# Patient Record
Sex: Male | Born: 1985 | Race: Black or African American | Hispanic: No | Marital: Single | Smoking: Former smoker
Health system: Southern US, Community
[De-identification: ages and names within clinical notes are randomized; demographics above are authoritative.]

## PROBLEM LIST (undated history)

## (undated) HISTORY — PX: HAND SURGERY: SHX662

## (undated) HISTORY — PX: FRACTURE SURGERY: SHX138

## (undated) HISTORY — PX: APPENDECTOMY: SHX54

---

## 2015-02-10 ENCOUNTER — Emergency Department
Admission: EM | Admit: 2015-02-10 | Discharge: 2015-02-10 | Disposition: A | Discharge status: Home / Self Care | Payer: No Typology Code available for payment source | Attending: Emergency Medicine | Admitting: Emergency Medicine

## 2015-02-10 ENCOUNTER — Emergency Department: Payer: No Typology Code available for payment source

## 2015-02-10 ENCOUNTER — Encounter: Payer: Self-pay | Admitting: Emergency Medicine

## 2015-02-10 DIAGNOSIS — S22080A Wedge compression fracture of T11-T12 vertebra, initial encounter for closed fracture: Secondary | ICD-10-CM | POA: Insufficient documentation

## 2015-02-10 DIAGNOSIS — Y9389 Activity, other specified: Secondary | ICD-10-CM | POA: Insufficient documentation

## 2015-02-10 DIAGNOSIS — IMO0002 Reserved for concepts with insufficient information to code with codable children: Secondary | ICD-10-CM

## 2015-02-10 DIAGNOSIS — Y92410 Unspecified street and highway as the place of occurrence of the external cause: Secondary | ICD-10-CM | POA: Insufficient documentation

## 2015-02-10 DIAGNOSIS — Y998 Other external cause status: Secondary | ICD-10-CM | POA: Insufficient documentation

## 2015-02-10 DIAGNOSIS — S299XXA Unspecified injury of thorax, initial encounter: Secondary | ICD-10-CM | POA: Diagnosis present

## 2015-02-10 MED ORDER — OXYCODONE-ACETAMINOPHEN 5-325 MG PO TABS: 1.0000 | ORAL_TABLET | Freq: Four times a day (QID) | ORAL | Status: AC | PRN

## 2015-02-10 MED ORDER — NAPROXEN 500 MG PO TABS: 500.0000 mg | ORAL_TABLET | Freq: Two times a day (BID) | ORAL | Status: DC

## 2015-02-10 NOTE — ED Notes (Signed)
MD Kinner at bedside  

## 2015-02-10 NOTE — ED Notes (Signed)
Patient transported to X-ray 
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
## 2015-02-10 NOTE — ED Notes (Signed)
Pt returned from X-ray.  

## 2016-06-02 ENCOUNTER — Emergency Department
Admission: EM | Admit: 2016-06-02 | Discharge: 2016-06-02 | Discharge status: Against Medical Advice | Payer: No Typology Code available for payment source

## 2017-05-28 ENCOUNTER — Emergency Department: Payer: Self-pay

## 2017-05-28 ENCOUNTER — Emergency Department
Admission: EM | Admit: 2017-05-28 | Discharge: 2017-05-28 | Disposition: A | Discharge status: Home / Self Care | Payer: Self-pay | Attending: Emergency Medicine | Admitting: Emergency Medicine

## 2017-05-28 DIAGNOSIS — S86112A Strain of other muscle(s) and tendon(s) of posterior muscle group at lower leg level, left leg, initial encounter: Secondary | ICD-10-CM

## 2017-05-28 DIAGNOSIS — S86812A Strain of other muscle(s) and tendon(s) at lower leg level, left leg, initial encounter: Secondary | ICD-10-CM | POA: Insufficient documentation

## 2017-05-28 DIAGNOSIS — X58XXXA Exposure to other specified factors, initial encounter: Secondary | ICD-10-CM | POA: Insufficient documentation

## 2017-05-28 DIAGNOSIS — Y9389 Activity, other specified: Secondary | ICD-10-CM | POA: Insufficient documentation

## 2017-05-28 DIAGNOSIS — F17200 Nicotine dependence, unspecified, uncomplicated: Secondary | ICD-10-CM | POA: Insufficient documentation

## 2017-05-28 DIAGNOSIS — M79605 Pain in left leg: Secondary | ICD-10-CM

## 2017-05-28 DIAGNOSIS — Y92018 Other place in single-family (private) house as the place of occurrence of the external cause: Secondary | ICD-10-CM | POA: Insufficient documentation

## 2017-05-28 DIAGNOSIS — Y999 Unspecified external cause status: Secondary | ICD-10-CM | POA: Insufficient documentation

## 2017-05-28 MED ORDER — HYDROCODONE-ACETAMINOPHEN 5-325 MG PO TABS: 1.0000 | ORAL_TABLET | Freq: Four times a day (QID) | ORAL | 0 refills | Status: DC | PRN

## 2017-05-28 MED ORDER — OXYCODONE-ACETAMINOPHEN 5-325 MG PO TABS
1.0000 | ORAL_TABLET | Freq: Once | ORAL | Status: AC
  Administered 2017-05-28: 1 via ORAL
  Filled 2017-05-28: qty 1

## 2017-05-28 NOTE — ED Notes (Signed)
Dr. Quale at bedside.  

## 2017-05-28 NOTE — ED Notes (Signed)
Patient transported to MRI 

## 2017-05-28 NOTE — ED Notes (Signed)
Checked on pt, resting comfortably at this time

## 2017-05-28 NOTE — Discharge Instructions (Signed)
Follow-up with Orthopedics in the coming week.  No driving while taking hydrocodone. Use only as prescribed.

## 2017-05-28 NOTE — ED Notes (Signed)
Knee immobilizer applied, crutches adjusted and given to pt

## 2017-05-28 NOTE — ED Triage Notes (Signed)
Patient c/o left lower leg/calf pain post fall at 1900 yesterday. Patient reports pain has slowly increased since original injury. Notable swelling seen on inspection of left calf.

## 2017-05-28 NOTE — ED Provider Notes (Signed)
[facility] Emergency Department Provider Note   First MD Initiated Contact with Patient 05/28/17 0410     (approximate)  I have reviewed the triage vital signs and the nursing notes.   HISTORY  Chief Complaint Leg Pain (left)    HPI Adrian Oconnor is a 32 y.o. male to the emergency department with nontraumatic left calf/knee pain which began at 7 PM last night.  Patient states that he was working on his car and then upon standing his left leg gave out following a pop.  Patient admits to 10 out of 10 pain since that time.  Patient states the pain is worse with standing on the leg and any ambulation.  Patient denies any alleviating factors.    Past medical history None There are no active problems to display for this patient.   Past surgical history None  Prior to Admission medications   Medication Sig Start Date End Date Taking? Authorizing Provider  naproxen (NAPROSYN) 500 MG tablet Take 1 tablet (500 mg total) by mouth 2 (two) times daily with a meal. 02/10/15   Jene Every, MD    Allergies No known drug allergies No family history on file.  Social History Social History   Tobacco Use  . Smoking status: Current Every Day Smoker  . Smokeless tobacco: Never Used  Substance Use Topics  . Alcohol use: Yes  . Drug use: No    Review of Systems Constitutional: No fever/chills Eyes: No visual changes. ENT: No sore throat. Cardiovascular: Denies chest pain. Respiratory: Denies shortness of breath. Gastrointestinal: No abdominal pain.  No nausea, no vomiting.  No diarrhea.  No constipation. Genitourinary: Negative for dysuria. Musculoskeletal: Negative for neck pain.  Negative for back pain.  Positive for left knee/calf pain. Integumentary: Negative for rash. Neurological: Negative for headaches, focal weakness or numbness.   ____________________________________________   PHYSICAL EXAM:  VITAL SIGNS: ED Triage Vitals  Enc Vitals  Group     BP 05/28/17 0416 121/80     Pulse Rate 05/28/17 0416 90     Resp 05/28/17 0416 18     Temp 05/28/17 0416 97.9 F (36.6 C)     Temp Source 05/28/17 0416 Oral     SpO2 05/28/17 0416 97 %     Weight 05/28/17 0415 67.1 kg (148 lb)     Height 05/28/17 0415 1.702 m (5\' 7" )     Head Circumference --      Peak Flow --      Pain Score 05/28/17 0414 10     Pain Loc --      Pain Edu? --      Excl. in GC? --     Constitutional: Alert and oriented. Well appearing and in no acute distress. Eyes: Conjunctivae are normal.  Head: Atraumatic. Mouth/Throat: Mucous membranes are moist.  Oropharynx non-erythematous. Neck: No stridor.  Cardiovascular: Normal rate, regular rhythm. Good peripheral circulation. Grossly normal heart sounds. Respiratory: Normal respiratory effort.  No retractions. Lungs CTAB. Gastrointestinal: Soft and nontender. No distention.  Musculoskeletal: Left calf pain with dorsiflexion of the left foot.  Pain with palpation of the left calf.  Pain with palpation and stressing  of the medial collateral ligament of the left knee.   Neurologic:  Normal speech and language. No gross focal neurologic deficits are appreciated.  Skin:  Skin is warm, dry and intact. No rash noted. Psychiatric: Mood and affect are normal. Speech and behavior are normal.   RADIOLOGY I, Stephenson N Asyah Candler,  personally viewed and evaluated these images (plain radiographs) as part of my medical decision making, as well as reviewing the written report by the radiologist.  Dg Tibia/fibula Left  Result Date: 05/28/2017 CLINICAL DATA:  Left lower leg and calf pain after a fall at 1900 hours yesterday. Pain increasing. EXAM: LEFT TIBIA AND FIBULA - 2 VIEW COMPARISON:  None. FINDINGS: There is no evidence of fracture or other focal bone lesions. Soft tissues are unremarkable. IMPRESSION: Negative. Electronically Signed   By: Burman NievesWilliam  Stevens M.D.   On: 05/28/2017 05:06       Procedures   ____________________________________________   INITIAL IMPRESSION / ASSESSMENT AND PLAN / ED COURSE  As part of my medical decision making, I reviewed the following data within the electronic MEDICAL RECORD [id number] year old male presented with above-stated history of nontraumatic left knee/calf pain.  X-ray revealed no acute abnormality.  Suspect possible ligamentous versus tendinous injury and as such MRI of the left lower extremity performed.  Patient given a Percocet in the emergency department with some improvement of pain awaiting MRI results at this time anticipate that the patient will be able to did be discharged home.  Patient's care transferred to Dr. Fanny BienQuale ____________________________________________  FINAL CLINICAL IMPRESSION(S) / ED DIAGNOSES  Final diagnoses:  Left leg pain     MEDICATIONS GIVEN DURING THIS VISIT:  Medications  oxyCODONE-acetaminophen (PERCOCET/ROXICET) 5-325 MG per tablet 1 tablet (1 tablet Oral Given 05/28/17 0435)     ED Discharge Orders    None       Note:  This document was prepared using Dragon voice recognition software and may include unintentional dictation errors.    Darci CurrentBrown, [place] N, MD 05/28/17 [phone]

## 2017-05-28 NOTE — ED Provider Notes (Signed)
Dg Tibia/fibula Left  Result Date: 05/28/2017 CLINICAL DATA:  Left lower leg and calf pain after a fall at 1900 hours yesterday. Pain increasing. EXAM: LEFT TIBIA AND FIBULA - 2 VIEW COMPARISON:  None. FINDINGS: There is no evidence of fracture or other focal bone lesions. Soft tissues are unremarkable. IMPRESSION: Negative. Electronically Signed   By: Burman NievesWilliam  Stevens M.D.   On: 05/28/2017 05:06   Mr Tibia Fibula Left Wo Contrast  Result Date: 05/28/2017 CLINICAL DATA:  Mid left lower leg pain and swelling since falling last night. EXAM: MRI OF LOWER LEFT EXTREMITY WITHOUT CONTRAST TECHNIQUE: Multiplanar, multisequence MR imaging of the left lower leg was performed. No intravenous contrast was administered. COMPARISON:  Radiograph same day. FINDINGS: Bones/Joint/Cartilage Examination extends from the knee through the distal lower leg. The ankle is incompletely visualized. The examination includes both lower legs on the axial and coronal images. The bones appear normal. Ligaments Not relevant for exam/indication. Muscles and Tendons There is T2 hyperintensity within medial head of the left gastrocnemius muscle distally. A small amount of fluid signal is interposed between gastrocnemius and soleus muscles distally. The plantaris tendon is not well visualized. The visualized Achilles tendons appear normal. No abnormalities are seen in right leg. Soft tissues No other signal abnormalities are identified in either lower leg. No vascular abnormalities are seen. IMPRESSION: 1. Muscular T2 hyperintensity medially in the left calf consistent with tennis leg. Edema is present within the medial head of the gastrocnemius muscle, and these findings are most commonly secondary to a partial tear at the myotendinous junction of the gastrocnemius muscle. Less common etiology is rupture of the plantaris tendon. 2. No osseous abnormality. Electronically Signed   By: Carey BullocksWilliam  Veazey M.D.   On: 05/28/2017 08:13     Patient  resting comfortably.  On exam he reports pain points towards the medial left gastrocnemius muscle, this correlates well with his MRI findings.  He is resting comfortably at this time.  Discussed careful need for follow-up with orthopedics, work restrictions, crutches.  Alert and oriented.  Left leg trong dorsalis pedis and posterior tibial pulses.  Capillary refill normal.  Denies numbness tingling in the lower foot.  I will prescribe the patient a narcotic pain medicine due to their condition which I anticipate will cause at least moderate pain short term. I discussed with the patient safe use of narcotic pain medicines, and that they are not to drive, work in dangerous areas, or ever take more than prescribed (no more than 1 pill every 6 hours). We discussed that this is the type of medication that can be  overdosed on and the risks of this type of medicine. Patient is very agreeable to only use as prescribed and to never use more than prescribed.   Return precautions and treatment recommendations and follow-up discussed with the patient who is agreeable with the plan.  Patient reports he like to be able to go back to work fully, but I again enforced the need for use of crutches and that he needs to be on limited duty, no heavy lifting, must use crutches and no weightbearing on the left foot until seen and cleared to return by orthopedics.  He is agreeable, and I have again reinforced his need as he could suffer further injury if he attempts to do any heavy lifting, walking on the leg while at still healing.  He will follow-up with orthopedics in about 1 week.  His friend is driving him home.    Jevaughn Degollado,  Loraine Leriche, MD 05/28/17 0900

## 2017-07-12 ENCOUNTER — Other Ambulatory Visit: Payer: Self-pay

## 2017-07-12 ENCOUNTER — Ambulatory Visit
Admission: EM | Admit: 2017-07-12 | Discharge: 2017-07-12 | Disposition: A | Discharge status: Home / Self Care | Payer: BLUE CROSS/BLUE SHIELD | Attending: Family Medicine | Admitting: Family Medicine

## 2017-07-12 DIAGNOSIS — S86112D Strain of other muscle(s) and tendon(s) of posterior muscle group at lower leg level, left leg, subsequent encounter: Secondary | ICD-10-CM

## 2017-07-12 DIAGNOSIS — S86112A Strain of other muscle(s) and tendon(s) of posterior muscle group at lower leg level, left leg, initial encounter: Secondary | ICD-10-CM

## 2017-07-12 NOTE — ED Triage Notes (Signed)
Patient states that he was seen by ED in January for a calf injury due to running. Patient states that he received light duty restrictions. Patient currently has no pain and returned to normal functioning states that he needs a note for work stating no restrictions.

## 2017-07-12 NOTE — ED Provider Notes (Signed)
[facility] ____________________________________________  Time seen: Approximately 2:53 PM  I have reviewed the triage vital signs and the nursing notes.   HISTORY  Chief Complaint Leg Pain   HPI Adrian Oconnor is a 32 y.o. male presenting for reevaluation and follow-up of left leg injury that occurred in January.  Patient reports that he was sprinting racing his younger cousins and then afterwards felt pain to his left leg in which she was seen in the emergency room for.  States at that time he had an MRI completed and it showed a muscle tear in his left leg.  Results of MRI reviewed by myself of 05/28/17 showing suspected gastrocnemius muscle tear.  Patient reports that he rested his leg for approximately 1 week, and reports at that point he increased his activity to normal at home and at work.  States he has been doing his regular job for over a month which does include frequent physical activity.  States however his file at work was reviewed and still noted to have a work limitation note present without restrictions being lifted, and states as he is up for promotion he needs a note stating no restrictions.  Patient denies any pain or complaints at this time.  States that he feels good.  Has continued to remain active.  Denies any pain, swelling, ecchymosis, paresthesias or discomfort in his left leg.   History reviewed. No pertinent past medical history.  There are no active problems to display for this patient.   Past Surgical History:  Procedure Laterality Date  . NO PAST SURGERIES       No current facility-administered medications for this encounter.  No current outpatient medications on file.  Allergies Patient has no known allergies.  History reviewed. No pertinent family history.  Social History Social History   Tobacco Use  . Smoking status: Current Every Day Smoker  . Smokeless tobacco: Never Used  Substance Use Topics  . Alcohol use: Yes  . Drug  use: No    Review of Systems Constitutional: No fever/chills Cardiovascular: Denies chest pain. Respiratory: Denies shortness of breath. Musculoskeletal: Negative for back pain. Skin: Negative for rash.  ____________________________________________   PHYSICAL EXAM:  VITAL SIGNS: ED Triage Vitals  Enc Vitals Group     BP 07/12/17 1421 123/70     Pulse Rate 07/12/17 1421 73     Resp 07/12/17 1421 18     Temp 07/12/17 1421 98.2 F (36.8 C)     Temp Source 07/12/17 1421 Oral     SpO2 07/12/17 1421 98 %     Weight 07/12/17 1419 145 lb (65.8 kg)     Height 07/12/17 1419 5\' 7"  (1.702 m)     Head Circumference --      Peak Flow --      Pain Score 07/12/17 1419 0     Pain Loc --      Pain Edu? --      Excl. in GC? --     Constitutional: Alert and oriented. Well appearing and in no acute distress. Cardiovascular: Normal rate, regular rhythm. Grossly normal heart sounds.  Good peripheral circulation. Respiratory: Normal respiratory effort without tachypnea nor retractions. Breath sounds are clear and equal bilaterally. No wheezes, rales, rhonchi. Musculoskeletal: Steady gait. Bilateral pedal pulses equal and easily palpated. Bilateral lower extremities nontender to palpation with full range of motion, no pain with range of motion, able to weight bear on each leg, able to weight-bear plantar flexion and dorsiflexion without  pain, no edema noted bilaterally.  Neurologic:  Normal speech and language. No gross focal neurologic deficits are appreciated. Speech is normal. No gait instability.  Skin:  Skin is warm, dry and intact. No rash noted. Psychiatric: Mood and affect are normal. Speech and behavior are normal. Patient exhibits appropriate insight and judgment   ___________________________________________   LABS (all labs ordered are listed, but only abnormal results are displayed)  Labs Reviewed - No data to  display ____________________________________________  RADIOLOGY  Reviewed MRI results from 05/28/2017.  PROCEDURES Procedures    INITIAL IMPRESSION / ASSESSMENT AND PLAN / ED COURSE  Pertinent labs & imaging results that were available during my care of the patient were reviewed by me and considered in my medical decision making (see chart for details).  Well-appearing patient.  No acute distress.  History of gastrocnemius muscle tear in January of this year.  Patient reports he quickly healed and is been doing well.  States he needs a work note stating no restrictions, note given.  Discussed follow up and return parameters including no resolution or any worsening concerns. Patient verbalized understanding and agreed to plan.   ____________________________________________   FINAL CLINICAL IMPRESSION(S) / ED DIAGNOSES  Final diagnoses:  Gastrocnemius muscle tear, left, subsequent encounter     ED Discharge Orders    None       Note: This dictation was prepared with Dragon dictation along with smaller phrase technology. Any transcriptional errors that result from this process are unintentional.         Renford DillsMiller, Cheyrl Buley, NP 07/12/17 1511

## 2017-07-12 NOTE — Discharge Instructions (Signed)
° °  Follow up with your primary care physician this week as needed. Return to Urgent care for new or worsening concerns.  ° °

## 2017-07-28 ENCOUNTER — Emergency Department: Payer: BLUE CROSS/BLUE SHIELD

## 2017-07-28 ENCOUNTER — Emergency Department
Admission: EM | Admit: 2017-07-28 | Discharge: 2017-07-28 | Disposition: A | Discharge status: Home / Self Care | Payer: BLUE CROSS/BLUE SHIELD | Attending: Emergency Medicine | Admitting: Emergency Medicine

## 2017-07-28 ENCOUNTER — Encounter: Payer: Self-pay | Admitting: Emergency Medicine

## 2017-07-28 ENCOUNTER — Other Ambulatory Visit: Payer: Self-pay

## 2017-07-28 DIAGNOSIS — Y9389 Activity, other specified: Secondary | ICD-10-CM | POA: Diagnosis not present

## 2017-07-28 DIAGNOSIS — S60222A Contusion of left hand, initial encounter: Secondary | ICD-10-CM

## 2017-07-28 DIAGNOSIS — S39012A Strain of muscle, fascia and tendon of lower back, initial encounter: Secondary | ICD-10-CM | POA: Diagnosis not present

## 2017-07-28 DIAGNOSIS — Y92410 Unspecified street and highway as the place of occurrence of the external cause: Secondary | ICD-10-CM | POA: Insufficient documentation

## 2017-07-28 DIAGNOSIS — S3992XA Unspecified injury of lower back, initial encounter: Secondary | ICD-10-CM | POA: Diagnosis present

## 2017-07-28 DIAGNOSIS — S86112A Strain of other muscle(s) and tendon(s) of posterior muscle group at lower leg level, left leg, initial encounter: Secondary | ICD-10-CM

## 2017-07-28 DIAGNOSIS — Y999 Unspecified external cause status: Secondary | ICD-10-CM | POA: Insufficient documentation

## 2017-07-28 DIAGNOSIS — F17200 Nicotine dependence, unspecified, uncomplicated: Secondary | ICD-10-CM | POA: Diagnosis not present

## 2017-07-28 MED ORDER — METHOCARBAMOL 500 MG PO TABS: 500.0000 mg | ORAL_TABLET | Freq: Four times a day (QID) | ORAL | 0 refills | Status: DC

## 2017-07-28 MED ORDER — MELOXICAM 15 MG PO TABS: 15.0000 mg | ORAL_TABLET | Freq: Every day | ORAL | 0 refills | Status: DC

## 2017-07-28 MED ORDER — OXYCODONE-ACETAMINOPHEN 5-325 MG PO TABS
1.0000 | ORAL_TABLET | Freq: Once | ORAL | Status: AC
Start: 2017-07-28 — End: 2017-07-28
  Administered 2017-07-28: 1 via ORAL
  Filled 2017-07-28: qty 1

## 2017-07-28 MED ORDER — METHOCARBAMOL 500 MG PO TABS
1000.0000 mg | ORAL_TABLET | Freq: Once | ORAL | Status: AC
  Administered 2017-07-28: 500 mg via ORAL
  Filled 2017-07-28: qty 2

## 2017-07-28 MED ORDER — MELOXICAM 7.5 MG PO TABS
15.0000 mg | ORAL_TABLET | Freq: Once | ORAL | Status: AC
Start: 2017-07-28 — End: 2017-07-28
  Administered 2017-07-28: 15 mg via ORAL
  Filled 2017-07-28: qty 2

## 2017-07-28 NOTE — ED Notes (Signed)
Patient transported to X-ray 
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
## 2017-09-08 ENCOUNTER — Emergency Department: Payer: BLUE CROSS/BLUE SHIELD

## 2017-09-08 ENCOUNTER — Other Ambulatory Visit: Payer: Self-pay

## 2017-09-08 DIAGNOSIS — Z79899 Other long term (current) drug therapy: Secondary | ICD-10-CM | POA: Insufficient documentation

## 2017-09-08 DIAGNOSIS — F17210 Nicotine dependence, cigarettes, uncomplicated: Secondary | ICD-10-CM | POA: Diagnosis not present

## 2017-09-08 DIAGNOSIS — R0602 Shortness of breath: Secondary | ICD-10-CM | POA: Diagnosis not present

## 2017-09-08 DIAGNOSIS — F959 Tic disorder, unspecified: Secondary | ICD-10-CM | POA: Insufficient documentation

## 2017-09-08 LAB — CBC WITH DIFFERENTIAL/PLATELET
Basophils Absolute: 0 10*3/uL (ref 0–0.1)
Basophils Relative: 0 %
Eosinophils Absolute: 0.1 10*3/uL (ref 0–0.7)
Eosinophils Relative: 2 %
HCT: 43.2 % (ref 40.0–52.0)
HEMOGLOBIN: 14.9 g/dL (ref 13.0–18.0)
LYMPHS ABS: 2.3 10*3/uL (ref 1.0–3.6)
LYMPHS PCT: 46 %
MCH: 30.6 pg (ref 26.0–34.0)
MCHC: 34.6 g/dL (ref 32.0–36.0)
MCV: 88.5 fL (ref 80.0–100.0)
Monocytes Absolute: 0.4 10*3/uL (ref 0.2–1.0)
Monocytes Relative: 8 %
NEUTROS PCT: 44 %
Neutro Abs: 2.2 10*3/uL (ref 1.4–6.5)
Platelets: 254 10*3/uL (ref 150–440)
RBC: 4.88 MIL/uL (ref 4.40–5.90)
RDW: 12.8 % (ref 11.5–14.5)
WBC: 5.1 10*3/uL (ref 3.8–10.6)

## 2017-09-08 NOTE — ED Triage Notes (Signed)
Pt states that he is having difficulty breathing - he states that he was at work Saturday and became short of breath - when he returned to work today they told him he needed to go to ED to be evaluated - pt denies any shortness of breath or difficulty of breathing at this time - he says this has been on and off since age 32

## 2017-09-09 ENCOUNTER — Emergency Department
Admission: EM | Admit: 2017-09-09 | Discharge: 2017-09-09 | Disposition: A | Discharge status: Home / Self Care | Payer: BLUE CROSS/BLUE SHIELD | Attending: Emergency Medicine | Admitting: Emergency Medicine

## 2017-09-09 DIAGNOSIS — F959 Tic disorder, unspecified: Secondary | ICD-10-CM

## 2017-09-09 LAB — COMPREHENSIVE METABOLIC PANEL
ALT: 22 U/L (ref 17–63)
ANION GAP: 8 (ref 5–15)
AST: 27 U/L (ref 15–41)
Albumin: 4.6 g/dL (ref 3.5–5.0)
Alkaline Phosphatase: 63 U/L (ref 38–126)
BUN: 9 mg/dL (ref 6–20)
CO2: 29 mmol/L (ref 22–32)
CREATININE: 0.85 mg/dL (ref 0.61–1.24)
Calcium: 9.5 mg/dL (ref 8.9–10.3)
Chloride: 102 mmol/L (ref 101–111)
Glucose, Bld: 88 mg/dL (ref 65–99)
Potassium: 3.3 mmol/L — ABNORMAL LOW (ref 3.5–5.1)
Sodium: 139 mmol/L (ref 135–145)
Total Bilirubin: 1.7 mg/dL — ABNORMAL HIGH (ref 0.3–1.2)
Total Protein: 8 g/dL (ref 6.5–8.1)

## 2017-09-09 NOTE — ED Provider Notes (Signed)
[facility] Emergency Department Provider Note  Time seen: 3:16 AM  I have reviewed the triage vital signs and the nursing notes.   HISTORY  Chief Complaint Shortness of Breath    HPI Adrian Oconnor is a 32 y.o. male with no past medical history who presents to the emergency department for difficulty breathing/making a noise.  According to the patient ever since he was 32 years old he has been making a short cast squeak noise breathing and quickly.  Patient states it has become more noticeable and he had a happen a lot at work on Saturday to the point where his boss wanted him to go get checked out.  He returned to work yesterday, but he said since he was still making the noise the box wanted him to go to the ER to make sure he was okay to work.  Patient denies any chest pain or trouble breathing.  Denies any asthma.  Largely negative review of systems.  Patient makes noise multiple times throughout our evaluation and history.  It is much more consistent with a tic disorder than any type of shortness of breath.   History reviewed. No pertinent past medical history.  There are no active problems to display for this patient.   Past Surgical History:  Procedure Laterality Date  . NO PAST SURGERIES      Prior to Admission medications   Medication Sig Start Date End Date Taking? Authorizing Provider  meloxicam (MOBIC) 15 MG tablet Take 1 tablet (15 mg total) by mouth daily. 07/28/17   Cuthriell, Delorise Royals, PA-C  methocarbamol (ROBAXIN) 500 MG tablet Take 1 tablet (500 mg total) by mouth 4 (four) times daily. 07/28/17   Cuthriell, Delorise Royals, PA-C    No Known Allergies  No family history on file.  Social History Social History   Tobacco Use  . Smoking status: Current Every Day Smoker    Packs/day: 0.50    Types: Cigarettes  . Smokeless tobacco: Never Used  Substance Use Topics  . Alcohol use: Not Currently  . Drug use: No    Review of  Systems Constitutional: Negative for fever. Cardiovascular: Negative for chest pain. Respiratory: Negative for shortness of breath.  Positive for making a noise when he breathes in at times. Gastrointestinal: Negative for abdominal pain All other ROS negative  ____________________________________________   PHYSICAL EXAM:  VITAL SIGNS: ED Triage Vitals [09/08/17 2257]  Enc Vitals Group     BP 137/89     Pulse Rate 94     Resp 16     Temp (!) 97.2 F (36.2 C)     Temp Source Oral     SpO2 100 %     Weight 140 lb (63.5 kg)     Height  (1.676 m)     Head Circumference      Peak Flow      Pain Score 0     Pain Loc      Pain Edu?      Excl. in GC?     Constitutional: Alert and oriented. Well appearing and in no distress. Eyes: Normal exam ENT   Head: Normocephalic and atraumatic.   Mouth/Throat: Mucous membranes are moist. Cardiovascular: Normal rate, regular rhythm. No murmur Respiratory: Normal respiratory effort without tachypnea nor retractions. Breath sounds are clear and equal bilaterally. No wheezes/rales/rhonchi. Gastrointestinal: Soft and nontender. No distention. Musculoskeletal: Nontender with normal range of motion in all extremities.  Neurologic:  Normal speech and language.  No gross focal neurologic deficits  Skin:  Skin is warm, dry and intact.  Psychiatric: Mood and affect are normal.  ____________________________________________   RADIOLOGY  Chest x-ray shows no acute disease.  ____________________________________________   INITIAL IMPRESSION / ASSESSMENT AND PLAN / ED COURSE  Pertinent labs & imaging results that were available during my care of the patient were reviewed by me and considered in my medical decision making (see chart for details).  Patient presents to the emergency department for making a noise when he breathes and very quickly.  Patient makes a short high-pitched noise, this is a tic disorder.  Not stridor, no  shortness of breath, no wheeze.  Patient states he has been told he had a tic disorder as a child but states it will go away for months or years at a time and then come back.  States it has been more prevalent recently.  Denies any trouble breathing.  Patient's labs are normal.  X-ray is normal.  I discussed with the patient follow-up with a primary care doctor for further evaluation and possible trial medication if his PCP believed it was warranted for tic disorder.  ____________________________________________   FINAL CLINICAL IMPRESSION(S) / ED DIAGNOSES  Tic disorder    Minna Antis, MD 09/09/17 [phone]

## 2017-09-09 NOTE — ED Notes (Signed)
Per pt, states he gets infrequent episodes where he feels [facility]. Pt states he was at work and was dehydrated from not drinking water and had an episode. Pt states this occurred on Saturday. Pt denies having this feeling at this time, denies hx of asthma. Pt states he has a chronic morning cough but once he is able to cough up the sputum he denies any further complications with the cough. Pt A&O at this time and in NAD.

## 2017-09-09 NOTE — ED Notes (Signed)
Pt to front desk requesting work note so pt can LWBS. Pt educated on policy that pt has not seen ED provider to give work note. Pt sts, "Okay, fine I guess I will just stay."

## 2017-10-18 ENCOUNTER — Other Ambulatory Visit: Payer: Self-pay

## 2017-10-18 ENCOUNTER — Emergency Department
Admission: EM | Admit: 2017-10-18 | Discharge: 2017-10-18 | Disposition: A | Discharge status: Home / Self Care | Payer: BLUE CROSS/BLUE SHIELD | Attending: Emergency Medicine | Admitting: Emergency Medicine

## 2017-10-18 ENCOUNTER — Emergency Department: Payer: BLUE CROSS/BLUE SHIELD

## 2017-10-18 DIAGNOSIS — F17210 Nicotine dependence, cigarettes, uncomplicated: Secondary | ICD-10-CM | POA: Diagnosis not present

## 2017-10-18 DIAGNOSIS — Y999 Unspecified external cause status: Secondary | ICD-10-CM | POA: Diagnosis not present

## 2017-10-18 DIAGNOSIS — Z79899 Other long term (current) drug therapy: Secondary | ICD-10-CM | POA: Insufficient documentation

## 2017-10-18 DIAGNOSIS — Y939 Activity, unspecified: Secondary | ICD-10-CM | POA: Diagnosis not present

## 2017-10-18 DIAGNOSIS — Y929 Unspecified place or not applicable: Secondary | ICD-10-CM | POA: Diagnosis not present

## 2017-10-18 DIAGNOSIS — R0789 Other chest pain: Secondary | ICD-10-CM | POA: Insufficient documentation

## 2017-10-18 DIAGNOSIS — R1012 Left upper quadrant pain: Secondary | ICD-10-CM | POA: Insufficient documentation

## 2017-10-18 LAB — BASIC METABOLIC PANEL
ANION GAP: 9 (ref 5–15)
BUN: 11 mg/dL (ref 6–20)
CHLORIDE: 106 mmol/L (ref 101–111)
CO2: 25 mmol/L (ref 22–32)
CREATININE: 0.89 mg/dL (ref 0.61–1.24)
Calcium: 9.3 mg/dL (ref 8.9–10.3)
GFR calc non Af Amer: >60 mL/min (ref >60)
Glucose, Bld: 97 mg/dL (ref 65–99)
Potassium: 3.5 mmol/L (ref 3.5–5.1)
Sodium: 140 mmol/L (ref 135–145)

## 2017-10-18 LAB — CBC
HEMATOCRIT: 42.4 % (ref 40.0–52.0)
HEMOGLOBIN: 14.5 g/dL (ref 13.0–18.0)
MCH: 30.3 pg (ref 26.0–34.0)
MCHC: 34.2 g/dL (ref 32.0–36.0)
MCV: 88.4 fL (ref 80.0–100.0)
Platelets: 250 10*3/uL (ref 150–440)
RBC: 4.79 MIL/uL (ref 4.40–5.90)
RDW: 12.8 % (ref 11.5–14.5)
WBC: 6.2 10*3/uL (ref 3.8–10.6)

## 2017-10-18 MED ORDER — MORPHINE SULFATE (PF) 4 MG/ML IV SOLN
4.0000 mg | Freq: Once | INTRAVENOUS | Status: AC
  Administered 2017-10-18: 4 mg via INTRAVENOUS
  Filled 2017-10-18: qty 1

## 2017-10-18 MED ORDER — HYDROCODONE-ACETAMINOPHEN 5-325 MG PO TABS: 1.0000 | ORAL_TABLET | ORAL | 0 refills | Status: DC | PRN

## 2017-10-18 MED ORDER — ONDANSETRON HCL 4 MG/2ML IJ SOLN
4.0000 mg | Freq: Once | INTRAMUSCULAR | Status: AC
  Administered 2017-10-18: 4 mg via INTRAVENOUS
  Filled 2017-10-18: qty 2

## 2017-10-18 MED ORDER — IOPAMIDOL (ISOVUE-370) INJECTION 76%
75.0000 mL | Freq: Once | INTRAVENOUS | Status: AC | PRN
  Administered 2017-10-18: 75 mL via INTRAVENOUS

## 2017-10-18 MED ORDER — SODIUM CHLORIDE 0.9 % IV BOLUS
1000.0000 mL | Freq: Once | INTRAVENOUS | Status: AC
Start: 2017-10-18 — End: 2017-10-18
  Administered 2017-10-18: 1000 mL via INTRAVENOUS

## 2017-10-18 NOTE — ED Notes (Signed)
Patient transported to CT 
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
## 2017-10-26 ENCOUNTER — Emergency Department
Admission: EM | Admit: 2017-10-26 | Discharge: 2017-10-26 | Disposition: A | Discharge status: Home / Self Care | Payer: BLUE CROSS/BLUE SHIELD | Attending: Emergency Medicine | Admitting: Emergency Medicine

## 2017-10-26 ENCOUNTER — Encounter: Payer: Self-pay | Admitting: Emergency Medicine

## 2017-10-26 DIAGNOSIS — Z711 Person with feared health complaint in whom no diagnosis is made: Secondary | ICD-10-CM | POA: Insufficient documentation
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
## 2018-03-17 ENCOUNTER — Other Ambulatory Visit: Payer: Self-pay

## 2018-03-17 ENCOUNTER — Ambulatory Visit
Admission: EM | Admit: 2018-03-17 | Discharge: 2018-03-17 | Disposition: A | Discharge status: Home / Self Care | Payer: BLUE CROSS/BLUE SHIELD | Attending: Family Medicine | Admitting: Family Medicine

## 2018-03-17 DIAGNOSIS — J020 Streptococcal pharyngitis: Secondary | ICD-10-CM

## 2018-03-17 LAB — RAPID STREP SCREEN (MED CTR MEBANE ONLY): STREPTOCOCCUS, GROUP A SCREEN (DIRECT): POSITIVE — AB

## 2018-03-17 MED ORDER — AMOXICILLIN 500 MG PO TABS: 500.0000 mg | ORAL_TABLET | Freq: Two times a day (BID) | ORAL | 0 refills | Status: DC

## 2018-03-17 MED ORDER — LIDOCAINE VISCOUS HCL 2 % MT SOLN: OROMUCOSAL | 0 refills | Status: DC

## 2018-03-17 NOTE — ED Provider Notes (Signed)
[facility]    CSN: [id number] Arrival date & time: 03/17/18  1001  History   Chief Complaint Chief Complaint  Patient presents with  . Sore Throat   HPI  32 year old male presents with sore throat.  Patient reports that he has not been going well since Monday.  Reports severe sore throat.  Decreased p.o. intake.  Associated subjective fever.  He has taken NyQuil without resolution.  He reports associated shortness of breath.  When asked to further explain this he reports that he has difficulty when breathing through his mouth.  No other associated symptoms.  No other complaints.  PMH, Surgical Hx, Family Hx, Social History reviewed and updated as below.  PMH: Acne  Past Surgical History:  Procedure Laterality Date  . APPENDECTOMY    . HAND SURGERY Right    Home Medications    Prior to Admission medications   Medication Sig Start Date End Date Taking? Authorizing Provider  amoxicillin (AMOXIL) 500 MG tablet Take 1 tablet (500 mg total) by mouth 2 (two) times daily. 03/17/18   Tommie Samsook, Vinod Mikesell G, DO  lidocaine (XYLOCAINE) 2 % solution Gargle 15 mL every 3 hours as needed. May swallow if desired. 03/17/18   Tommie Samsook, Trayven Lumadue G, DO    Family History Family History  Problem Relation Age of Onset  . Hypertension Mother   . Hypertension Father     Social History Social History   Tobacco Use  . Smoking status: Former Smoker    Last attempt to quit: 03/13/2018    Years since quitting: 0.0  . Smokeless tobacco: Never Used  Substance Use Topics  . Alcohol use: Not Currently  . Drug use: No     Allergies   Patient has no known allergies.   Review of Systems Review of Systems  Constitutional: Positive for fever.  HENT: Positive for sore throat.   Respiratory: Positive for shortness of breath.    Physical Exam Triage Vital Signs ED Triage Vitals  Enc Vitals Group     BP 03/17/18 1012 122/81     Pulse Rate 03/17/18 1012 93     Resp 03/17/18 1012 18   Temp 03/17/18 1012 99.4 F (37.4 C)     Temp Source 03/17/18 1012 Oral     SpO2 03/17/18 1012 100 %     Weight 03/17/18 1012 140 lb (63.5 kg)     Height 03/17/18 1012 5\' 8"  (1.727 m)     Head Circumference --      Peak Flow --      Pain Score 03/17/18 1011 10     Pain Loc --      Pain Edu? --      Excl. in GC? --    Updated Vital Signs BP 122/81 (BP Location: Left Arm)   Pulse 93   Temp 99.4 F (37.4 C) (Oral)   Resp 18   Ht 5\' 8"  (1.727 m)   Wt 63.5 kg   SpO2 100%   BMI 21.29 kg/m   Visual Acuity Right Eye Distance:   Left Eye Distance:   Bilateral Distance:    Right Eye Near:   Left Eye Near:    Bilateral Near:     Physical Exam  Constitutional: He is oriented to person, place, and time. He appears well-developed. No distress.  HENT:  Oropharynx with severe erythema, tonsillar edema.  Eyes: Conjunctivae are normal. Right eye exhibits no discharge. Left eye exhibits no discharge.  Neck: Neck supple.  Cardiovascular: Normal  rate and regular rhythm.  Pulmonary/Chest: Effort normal and breath sounds normal. He has no wheezes. He has no rales.  Lymphadenopathy:    He has cervical adenopathy.  Neurological: He is alert and oriented to person, place, and time.  Psychiatric: He has a normal mood and affect. His behavior is normal.  Nursing note and vitals reviewed.  UC Treatments / Results  Labs (all labs ordered are listed, but only abnormal results are displayed) Labs Reviewed  RAPID STREP SCREEN ([facility] ONLY) - Abnormal; Notable for the following components:      Result Value   Streptococcus, Group A Screen (Direct) POSITIVE (*)    All other components within normal limits    EKG None  Radiology No results found.  Procedures Procedures (including critical care time)  Medications Ordered in UC Medications - No data to display  Initial Impression / Assessment and Plan / UC Course  I have reviewed the triage vital signs and the nursing  notes.  Pertinent labs & imaging results that were available during my care of the patient were reviewed by me and considered in my medical decision making (see chart for details).    32 year old male presents with strep pharyngitis.  Treating with amoxicillin.  Viscous lidocaine as needed.  Final Clinical Impressions(s) / UC Diagnoses   Final diagnoses:  Strep pharyngitis     Discharge Instructions     Antibiotic as prescribed.  Take care  Dr. Adriana Simas    ED Prescriptions    Medication Sig Dispense Auth. Provider   amoxicillin (AMOXIL) 500 MG tablet Take 1 tablet (500 mg total) by mouth 2 (two) times daily. 20 tablet Hinton Luellen G, DO   lidocaine (XYLOCAINE) 2 % solution Gargle 15 mL every 3 hours as needed. May swallow if desired. 200 mL Tommie Sams, DO     Controlled Substance Prescriptions [place] Controlled Substance Registry consulted? Not Applicable   Tommie Sams, DO 03/17/18 1234

## 2018-03-17 NOTE — ED Triage Notes (Signed)
Patient complains of sore throat and fever since Monday. Painful to swallow or talk.

## 2018-03-17 NOTE — Discharge Instructions (Signed)
Antibiotic as prescribed.  Take care  Dr. Nuha Degner  
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
## 2018-07-31 ENCOUNTER — Other Ambulatory Visit: Payer: Self-pay

## 2018-07-31 ENCOUNTER — Emergency Department
Admission: EM | Admit: 2018-07-31 | Discharge: 2018-07-31 | Disposition: A | Discharge status: Home / Self Care | Payer: BLUE CROSS/BLUE SHIELD | Attending: Emergency Medicine | Admitting: Emergency Medicine

## 2018-07-31 ENCOUNTER — Encounter: Payer: Self-pay | Admitting: Emergency Medicine

## 2018-07-31 DIAGNOSIS — A084 Viral intestinal infection, unspecified: Secondary | ICD-10-CM | POA: Insufficient documentation

## 2018-07-31 DIAGNOSIS — Z87891 Personal history of nicotine dependence: Secondary | ICD-10-CM | POA: Insufficient documentation

## 2018-07-31 MED ORDER — ONDANSETRON 4 MG PO TBDP: 4.0000 mg | ORAL_TABLET | Freq: Three times a day (TID) | ORAL | 0 refills | Status: DC | PRN

## 2018-07-31 MED ORDER — ONDANSETRON 4 MG PO TBDP
4.0000 mg | ORAL_TABLET | Freq: Once | ORAL | Status: AC
  Administered 2018-07-31: 4 mg via ORAL
  Filled 2018-07-31: qty 1

## 2018-07-31 NOTE — ED Provider Notes (Signed)
[facility] Emergency Department Provider Note  ____________________________________________   First MD Initiated Contact with Patient 07/31/18 [phone]     (approximate)  I have reviewed the triage vital signs and the nursing notes.   HISTORY  Chief Complaint Diarrhea   HPI Adrian Oconnor is a 33 y.o. male presents to the ED with complaint of vomiting and diarrhea.  Patient states that in the last 24 hours he has had 1 diarrhea stool and also vomited once.  He began taking Pepto-Bismol yesterday 1 capful every 30 minutes.  He states his diarrhea has gone from brown, green and now black.  Patient denies any foul smell to his stools.  He denies any fever, chills, headache or body aches.     History reviewed. No pertinent past medical history.  There are no active problems to display for this patient.   Past Surgical History:  Procedure Laterality Date  . APPENDECTOMY    . HAND SURGERY Right     Prior to Admission medications   Medication Sig Start Date End Date Taking? Authorizing Provider  amoxicillin (AMOXIL) 500 MG tablet Take 1 tablet (500 mg total) by mouth 2 (two) times daily. 03/17/18   Tommie Sams, DO  lidocaine (XYLOCAINE) 2 % solution Gargle 15 mL every 3 hours as needed. May swallow if desired. 03/17/18   Tommie Sams, DO  ondansetron (ZOFRAN ODT) 4 MG disintegrating tablet Take 1 tablet (4 mg total) by mouth every 8 (eight) hours as needed for nausea or vomiting. 07/31/18   Tommi Rumps, PA-C    Allergies Patient has no known allergies.  Family History  Problem Relation Age of Onset  . Hypertension Mother   . Hypertension Father     Social History Social History   Tobacco Use  . Smoking status: Former Smoker    Last attempt to quit: 03/13/2018    Years since quitting: 0.3  . Smokeless tobacco: Never Used  Substance Use Topics  . Alcohol use: Not Currently  . Drug use: No    Review of Systems Constitutional: No  fever/chills Eyes: No visual changes. ENT: No sore throat. Cardiovascular: Denies chest pain. Respiratory: Denies shortness of breath.  No coughing. Gastrointestinal: No abdominal pain.  No nausea, positive vomiting.  Positive diarrhea.  No constipation. Genitourinary: Negative for dysuria. Musculoskeletal: Negative for muscle aches. Skin: Negative for rash. Neurological: Negative for headaches, focal weakness or numbness. ____________________________________________   PHYSICAL EXAM:  VITAL SIGNS: ED Triage Vitals  Enc Vitals Group     BP 07/31/18 0922 (!) 135/118     Pulse Rate 07/31/18 0922 86     Resp 07/31/18 0922 18     Temp 07/31/18 0922 98.3 F (36.8 C)     Temp Source 07/31/18 0922 Oral     SpO2 07/31/18 0922 100 %     Weight 07/31/18 0923 140 lb (63.5 kg)     Height 07/31/18 0923 5' 7.5" (1.715 m)     Head Circumference --      Peak Flow --      Pain Score 07/31/18 0922 0     Pain Loc --      Pain Edu? --      Excl. in GC? --    Constitutional: Alert and oriented. Well appearing and in no acute distress. Eyes: Conjunctivae are normal.  Head: Atraumatic. Nose: No congestion/rhinnorhea. Mouth/Throat: Mucous membranes are moist.  Oropharynx non-erythematous. Neck: No stridor.   Cardiovascular: Normal rate, regular rhythm.  Grossly normal heart sounds.  Good peripheral circulation. Respiratory: Normal respiratory effort.  No retractions. Lungs CTAB. Gastrointestinal: Soft and nontender. No distention.  Bowel sounds normoactive x4 quadrants. Musculoskeletal: His upper and lower extremities without any difficulty normal gait was noted without assistance. Neurologic:  Normal speech and language. No gross focal neurologic deficits are appreciated.  Skin:  Skin is warm, dry and intact. No rash noted. Psychiatric: Mood and affect are normal. Speech and behavior are normal.  ____________________________________________   LABS (all labs ordered are listed, but only  abnormal results are displayed)  Labs Reviewed - No data to display  PROCEDURES  Procedure(s) performed (including Critical Care):  Procedures  ____________________________________________   INITIAL IMPRESSION / ASSESSMENT AND PLAN / ED COURSE  As part of my medical decision making, I reviewed the following data within the electronic MEDICAL RECORD NUMBER Notes from prior ED visits and [place] Controlled Substance Database  Patient presents to the ED with complaint of one episode of vomiting and one episode of diarrhea in the last 24 hours.  Patient states that he took Pepto-Bismol 1 capful every 30 minutes yesterday and now was concerned because his stool was dark.  No abdominal pain was reported.  Patient denies any fever or chills.  Patient was given Zofran while in the ED and was feeling better.  He was discharged with instructions to remain on clear liquids the remainder of the day and discontinue using the Pepto-Bismol as he was overdosing on it.  Patient is to follow-up with his PCP or [facility] if any continued problems.  ____________________________________________   FINAL CLINICAL IMPRESSION(S) / ED DIAGNOSES  Final diagnoses:  Viral gastroenteritis     ED Discharge Orders         Ordered    ondansetron (ZOFRAN ODT) 4 MG disintegrating tablet  Every 8 hours PRN     07/31/18 0951           Note:  This document was prepared using Dragon voice recognition software and may include unintentional dictation errors.    Tommi Rumps, PA-C 07/31/18 1311    Dionne Bucy, MD 07/31/18 [phone]

## 2018-07-31 NOTE — ED Triage Notes (Signed)
Diarrhea x1 day, using pepto and is noticed his color of stool changing.

## 2018-07-31 NOTE — Discharge Instructions (Signed)
Follow-up with your primary care provider if any continued problems or [facility] acute care if needed.  Remain on clear liquids the next 24 hours.  Do not eat any solid foods to decrease or completely stop the diarrhea.  Zofran if needed for vomiting.  You may also take Tylenol if needed for fever or body aches.

## 2018-07-31 NOTE — ED Notes (Signed)
Patient reports if he eats he either has diarrhea or episode of emesis.

## 2018-11-14 ENCOUNTER — Other Ambulatory Visit: Payer: Self-pay

## 2018-11-14 ENCOUNTER — Ambulatory Visit
Admission: EM | Admit: 2018-11-14 | Discharge: 2018-11-14 | Disposition: A | Discharge status: Home / Self Care | Payer: Self-pay | Attending: Family Medicine | Admitting: Family Medicine

## 2018-11-14 ENCOUNTER — Encounter: Payer: Self-pay | Admitting: Emergency Medicine

## 2018-11-14 DIAGNOSIS — R112 Nausea with vomiting, unspecified: Secondary | ICD-10-CM

## 2018-11-14 NOTE — ED Provider Notes (Signed)
[facility] ____________________________________________  Time seen: Approximately 4:07 PM  I have reviewed the triage vital signs and the nursing notes.   HISTORY  Chief Complaint Nausea   HPI Adrian Oconnor is a 33 y.o. male presenting for evaluation of nausea and vomiting.  Patient reports he does not typically eat breakfast.  States this morning he picked up a biscuit from [employer], and he only had 5 minutes to eat and get into work.  States that he ate the food extremely quickly "shoved it in" and then immediately ran half a mile from the parking lot to the door of his workplace.  States when he got there he was out of breath from running and vomited.  States he got sent home from work due to this.  Denies any other nausea or vomiting episodes.  Denies any accompanying abdominal pain, diarrhea, constipation.  Last bowel movement yesterday described as normal.  No recent cough, congestion, sore throat, fevers, change in taste or smell, rash, chest pain or shortness of breath.  States he feels well.  Has continued remain active throughout the day and has continued to eat and drink well.  States needs a work note to to return to work.  History reviewed. No pertinent past medical history.  There are no active problems to display for this patient.   Past Surgical History:  Procedure Laterality Date  . APPENDECTOMY    . FRACTURE SURGERY    . HAND SURGERY Right      No current facility-administered medications for this encounter.  No current outpatient medications on file.  Allergies Patient has no known allergies.  Family History  Problem Relation Age of Onset  . Hypertension Mother   . Hypertension Father     Social History Social History   Tobacco Use  . Smoking status: Former Smoker    Quit date: 03/13/2018    Years since quitting: 0.6  . Smokeless tobacco: Never Used  Substance Use Topics  . Alcohol use: Not Currently  . Drug use: No    Review of  Systems Constitutional: No fever ENT: No sore throat. Cardiovascular: Denies chest pain. Respiratory: Denies shortness of breath. Gastrointestinal: As above. Genitourinary: Negative for dysuria. Musculoskeletal: Negative for back pain. Skin: Negative for rash. Neurological: Negative for headaches, focal weakness or numbness.  ____________________________________________   PHYSICAL EXAM:  VITAL SIGNS: ED Triage Vitals  Enc Vitals Group     BP 11/14/18 1536 123/81     Pulse Rate 11/14/18 1536 83     Resp 11/14/18 1536 16     Temp 11/14/18 1536 98 F (36.7 C)     Temp src --      SpO2 11/14/18 1536 100 %     Weight 11/14/18 1534 145 lb (65.8 kg)     Height 11/14/18 1534 5\' 7"  (1.702 m)     Head Circumference --      Peak Flow --      Pain Score 11/14/18 1534 0     Pain Loc --      Pain Edu? --      Excl. in [place]? --     Constitutional: Alert and oriented. Well appearing and in no acute distress. Eyes: Conjunctivae are normal.  ENT      Head: Normocephalic and atraumatic. Cardiovascular: Normal rate, regular rhythm. Grossly normal heart sounds.  Good peripheral circulation. Respiratory: Normal respiratory effort without tachypnea nor retractions. Breath sounds are clear and equal bilaterally. No wheezes, rales, rhonchi. Gastrointestinal: Soft  and nontender. Normal Bowel sounds. No CVA tenderness. Musculoskeletal:  No midline cervical, thoracic or lumbar tenderness to palpation.  Neurologic:  Normal speech and language. Speech is normal. No gait instability.  Skin:  Skin is warm, dry and intact. No rash noted. Psychiatric: Mood and affect are normal. Speech and behavior are normal. Patient exhibits appropriate insight and judgment   ___________________________________________   LABS (all labs ordered are listed, but only abnormal results are displayed)  Labs Reviewed - No data to display ____________________________________________  PROCEDURES Procedures   INITIAL  IMPRESSION / ASSESSMENT AND PLAN / ED COURSE  Pertinent labs & imaging results that were available during my care of the patient were reviewed by me and considered in my medical decision making (see chart for details).  Well-appearing patient.  No acute distress.  Single episode of nausea vomiting this morning after eating quickly and then running.  Suspect this is a single time episode with provoked cause.  Well-appearing without any continued complaints.  Encouraged supportive care monitoring.  Discussed reevaluation for any recurrence.  Work note given to return.  Discussed follow up with Primary care physician this week as needed. Discussed follow up and return parameters including no resolution or any worsening concerns. Patient verbalized understanding and agreed to plan.   ____________________________________________   FINAL CLINICAL IMPRESSION(S) / ED DIAGNOSES  Final diagnoses:  Non-intractable vomiting with nausea, unspecified vomiting type     ED Discharge Orders    None       Note: This dictation was prepared with Dragon dictation along with smaller phrase technology. Any transcriptional errors that result from this process are unintentional.         Renford DillsMiller, Othell Jaime, NP 11/14/18 1631

## 2018-11-14 NOTE — ED Triage Notes (Signed)
Patient states he ate breakfast really fast this morning and after running to the door at work vomited 1 time.  His supervisor sent him here to be "checked out" .  Patient is not experiencing any nausea at this time.

## 2018-11-14 NOTE — Discharge Instructions (Signed)
Monitor. If reoccurs, seek evaluation as discussed.   Follow up with your primary care physician this week as needed. Return to Urgent care for new or worsening concerns.

## 2018-12-18 ENCOUNTER — Emergency Department
Admission: EM | Admit: 2018-12-18 | Discharge: 2018-12-18 | Disposition: A | Discharge status: Home / Self Care | Payer: Self-pay | Attending: Emergency Medicine | Admitting: Emergency Medicine

## 2018-12-18 ENCOUNTER — Other Ambulatory Visit: Payer: Self-pay

## 2018-12-18 ENCOUNTER — Encounter: Payer: Self-pay | Admitting: Emergency Medicine

## 2018-12-18 DIAGNOSIS — R369 Urethral discharge, unspecified: Secondary | ICD-10-CM | POA: Insufficient documentation

## 2018-12-18 DIAGNOSIS — R300 Dysuria: Secondary | ICD-10-CM | POA: Insufficient documentation

## 2018-12-18 DIAGNOSIS — Z87891 Personal history of nicotine dependence: Secondary | ICD-10-CM | POA: Insufficient documentation

## 2018-12-18 DIAGNOSIS — N342 Other urethritis: Secondary | ICD-10-CM | POA: Insufficient documentation

## 2018-12-18 LAB — URINALYSIS, COMPLETE (UACMP) WITH MICROSCOPIC
Bacteria, UA: NONE SEEN
Bilirubin Urine: NEGATIVE
Glucose, UA: NEGATIVE mg/dL
Hgb urine dipstick: NEGATIVE
Ketones, ur: NEGATIVE mg/dL
Nitrite: NEGATIVE
Protein, ur: NEGATIVE mg/dL
Specific Gravity, Urine: 1.026 (ref 1.005–1.030)
WBC, UA: >50 WBC/hpf — ABNORMAL HIGH (ref 0–5)
pH: 6 (ref 5.0–8.0)

## 2018-12-18 MED ORDER — LIDOCAINE HCL (PF) 1 % IJ SOLN
2.1000 mL | Freq: Once | INTRAMUSCULAR | Status: AC
  Administered 2018-12-18: 10:00:00 2.1 mL
  Filled 2018-12-18: qty 5

## 2018-12-18 MED ORDER — SODIUM CHLORIDE 0.9% FLUSH: 3.0000 mL | Freq: Once | INTRAVENOUS | Status: DC

## 2018-12-18 MED ORDER — CEFTRIAXONE SODIUM 1 G IJ SOLR
500.0000 mg | Freq: Once | INTRAMUSCULAR | Status: AC
  Administered 2018-12-18: 500 mg via INTRAMUSCULAR
  Filled 2018-12-18: qty 10

## 2018-12-18 MED ORDER — AZITHROMYCIN 500 MG PO TABS
1000.0000 mg | ORAL_TABLET | Freq: Every day | ORAL | Status: DC
  Administered 2018-12-18: 1000 mg via ORAL
  Filled 2018-12-18: qty 2

## 2018-12-18 NOTE — ED Triage Notes (Signed)
Pt ambulatory into ED via POV c/o RLQ abdominal pain. Pt states that the pain started on Monday. Pt denies fevers or chills. Pt denies N/V/D. Pt is in NAD at this time.

## 2018-12-18 NOTE — ED Notes (Signed)
Upon talking to pt future, pt states that he is having painful urination and that he is also having penile discharge. Pt is concerned for STI. Pt states that the abdominal pain occurred at the beginning of the week and he is no longer having the pain in the RLQ, has not had pain since Tuesday. It reports noting "slimy" discharge in his boxers this morning.

## 2018-12-18 NOTE — ED Provider Notes (Signed)
[facility] Emergency Department Provider Note  ____________________________________________  Time seen: Approximately 9:50 AM  I have reviewed the triage vital signs and the nursing notes.   HISTORY  Chief Complaint Penile Discharge and Dysuria    HPI Ennis FortsWarren Hogate is a 33 y.o. male presents emergency department with complaint of dysuria for the past 6 days.  He noticed some penile discharge today.   His significant other is also experiencing some symptoms as well.  History reviewed. No pertinent past medical history.  There are no active problems to display for this patient.   Past Surgical History:  Procedure Laterality Date  . APPENDECTOMY    . FRACTURE SURGERY    . HAND SURGERY Right     Prior to Admission medications   Not on File    Allergies Patient has no known allergies.  Family History  Problem Relation Age of Onset  . Hypertension Mother   . Hypertension Father     Social History Social History   Tobacco Use  . Smoking status: Former Smoker    Quit date: 03/13/2018    Years since quitting: 0.7  . Smokeless tobacco: Never Used  Substance Use Topics  . Alcohol use: Not Currently  . Drug use: No    Review of Systems Constitutional: Negative for fever. Respiratory: Negative for shortness of breath or cough. Gastrointestinal: Negative for abdominal pain; negataive for nausea , negative for vomiting. Genitourinary: Positive for dysuria , positive for penile discharge. Musculoskeletal: Negative for back pain. Skin: Negative for acute skin changes/rash/lesion. ____________________________________________   PHYSICAL EXAM:  VITAL SIGNS: ED Triage Vitals  Enc Vitals Group     BP 12/18/18 0919 (!) 118/96     Pulse Rate 12/18/18 0919 80     Resp 12/18/18 0919 16     Temp 12/18/18 0919 98.2 F (36.8 C)     Temp Source 12/18/18 0919 Oral     SpO2 12/18/18 0919 100 %     Weight 12/18/18 0915 160 lb (72.6 kg)     Height  12/18/18 0915 5\' 7"  (1.702 m)     Head Circumference --      Peak Flow --      Pain Score 12/18/18 0914 2     Pain Loc --      Pain Edu? --      Excl. in GC? --     Constitutional: Alert and oriented. Well appearing and in no acute distress. Eyes: Conjunctivae are normal. Head: Atraumatic. Nose: No congestion/rhinnorhea. Mouth/Throat: Mucous membranes are moist. Respiratory: Normal respiratory effort.  No retractions. Gastrointestinal: Bowel sounds active x 4; Abdomen is soft without rebound or guarding. Genitourinary: Visual exam deferred. Musculoskeletal: No extremity tenderness nor edema.  Neurologic:  Normal speech and language. No gross focal neurologic deficits are appreciated. Speech is normal. No gait instability. Skin:  Skin is warm, dry and intact. No rash noted on exposed skin. Psychiatric: Mood and affect are normal. Speech and behavior are normal.  ____________________________________________   LABS (all labs ordered are listed, but only abnormal results are displayed)  Labs Reviewed  GC/CHLAMYDIA PROBE AMP  URINALYSIS, COMPLETE (UACMP) WITH MICROSCOPIC   ____________________________________________  RADIOLOGY  Not indicated. ____________________________________________  Procedures  ____________________________________________  33 year old male presenting to the emergency department for concern of STI. He will be treated based on symptoms. GC and chlamydia testing is now a send out test. He was advised to check his MyChart for results or await a call.  INITIAL IMPRESSION /  ASSESSMENT AND PLAN / ED COURSE  Pertinent labs & imaging results that were available during my care of the patient were reviewed by me and considered in my medical decision making (see chart for details).  ____________________________________________   FINAL CLINICAL IMPRESSION(S) / ED DIAGNOSES  Final diagnoses:  Dysuria  Urethritis    Note:  This document was prepared using  Dragon voice recognition software and may include unintentional dictation errors.    Victorino Dike, FNP 12/18/18 1008    Carrie Mew, MD 12/18/18 1520

## 2018-12-18 NOTE — ED Notes (Signed)
See triage note  Presents with hx of lower abd discomfort and some burning with urination on Monday  Then noticed some discharge on underwear today

## 2018-12-18 NOTE — Discharge Instructions (Signed)
Follow up with the health department for further testing.

## 2018-12-22 LAB — GC/CHLAMYDIA PROBE AMP
Chlamydia trachomatis, NAA: NEGATIVE
Neisseria Gonorrhoeae by PCR: POSITIVE — AB

## 2018-12-26 ENCOUNTER — Telehealth: Payer: Self-pay | Admitting: Emergency Medicine

## 2018-12-26 NOTE — Telephone Encounter (Signed)
Called [facility] to inform of positive gonorrhea test. He was treated in the ED.   No answer and no voicemail.

## 2022-04-11 ENCOUNTER — Emergency Department
Admission: EM | Admit: 2022-04-11 | Discharge: 2022-04-12 | Disposition: A | Discharge status: Home / Self Care | Payer: Medicaid Other | Attending: Emergency Medicine | Admitting: Emergency Medicine

## 2022-04-11 DIAGNOSIS — Z1152 Encounter for screening for COVID-19: Secondary | ICD-10-CM | POA: Insufficient documentation

## 2022-04-11 DIAGNOSIS — J111 Influenza due to unidentified influenza virus with other respiratory manifestations: Secondary | ICD-10-CM | POA: Diagnosis not present

## 2022-04-11 DIAGNOSIS — R509 Fever, unspecified: Secondary | ICD-10-CM | POA: Diagnosis present

## 2022-04-11 LAB — RESP PANEL BY RT-PCR (RSV, FLU A&B, COVID)  RVPGX2
Influenza A by PCR: POSITIVE — AB
Influenza B by PCR: NEGATIVE
Resp Syncytial Virus by PCR: NEGATIVE
SARS Coronavirus 2 by RT PCR: NEGATIVE

## 2022-04-11 NOTE — ED Provider Notes (Signed)
[facility] Provider Note  Patient Contact: 11:01 PM (approximate)   History   Fever   HPI  Adrian Oconnor is a 36 y.o. male who presents emerged part complaining congestion, cough, body aches, chills.  Symptoms x 2 days.  No chest pain or shortness of breath.  Patient states that his son came home sick and now others in the household are experiencing similar symptoms.     Physical Exam   Triage Vital Signs: ED Triage Vitals  Enc Vitals Group     BP 04/11/22 2051 108/65     Pulse Rate 04/11/22 2051 (!) 101     Resp 04/11/22 2051 16     Temp 04/11/22 2051 100.1 F (37.8 C)     Temp Source 04/11/22 2051 Oral     SpO2 04/11/22 2051 94 %     Weight 04/11/22 2052 140 lb (63.5 kg)     Height 04/11/22 2052 5\' 8"  (1.727 m)     Head Circumference --      Peak Flow --      Pain Score 04/11/22 2051 10     Pain Loc --      Pain Edu? --      Excl. in GC? --     Most recent vital signs: Vitals:   04/11/22 2051  BP: 108/65  Pulse: (!) 101  Resp: 16  Temp: 100.1 F (37.8 C)  SpO2: 94%     General: Alert and in no acute distress ENT:      Ears:       Nose:  Mild to moderatecongestion/rhinnorhea.        Mouth/Throat: Mucous membranes are moist. Neck: No stridor. No cervical spine tenderness to palpation.  Cardiovascular:  Good peripheral perfusion Respiratory: Normal respiratory effort without tachypnea or retractions. Lungs CTAB. Good air entry to the bases with no decreased or absent breath sounds. Musculoskeletal: Full range of motion to all extremities.  Neurologic:  No gross focal neurologic deficits are appreciated.  Skin:   No rash noted Other:   ED Results / Procedures / Treatments   Labs (all labs ordered are listed, but only abnormal results are displayed) Labs Reviewed  RESP PANEL BY RT-PCR (RSV, FLU A&B, COVID)  RVPGX2 - Abnormal; Notable for the following components:      Result Value   Influenza A by PCR POSITIVE (*)    All  other components within normal limits     EKG     RADIOLOGY    No results found.  PROCEDURES:  Critical Care performed: No  Procedures   MEDICATIONS ORDERED IN ED: Medications - No data to display   IMPRESSION / MDM / ASSESSMENT AND PLAN / ED COURSE  I reviewed the triage vital signs and the nursing notes.                              Differential diagnosis includes, but is not limited to, viral illness, COVID, flu   Patient's presentation is most consistent with acute presentation with potential threat to life or bodily function.   Patient's diagnosis is consistent with influenza.  Patient presents emergency department with flulike symptoms.  Confirmed positive on nasal swab.  No indication for further workup.  Care instructions at home are discussed with the patient.  Patient declines Tamiflu at this time which I feel is a good decision.  Plenty of fluids, plenty of rest, Tylenol Motrin.  Follow-up primary care as needed.  Return precautions discussed with the patient..  Patient is given ED precautions to return to the ED for any worsening or new symptoms.        FINAL CLINICAL IMPRESSION(S) / ED DIAGNOSES   Final diagnoses:  Influenza     Rx / DC Orders   ED Discharge Orders     None        Note:  This document was prepared using Dragon voice recognition software and may include unintentional dictation errors.   Racheal Patches, PA-C 04/12/22 0003    Merwyn Katos, MD 04/14/22 [phone]

## 2022-04-11 NOTE — ED Triage Notes (Signed)
Pt reports headache today, cold like symptoms he developed today. Reports at home fever of 103F, Took OTC medication prior to arrival

## 2024-01-07 ENCOUNTER — Emergency Department
Admission: EM | Admit: 2024-01-07 | Discharge: 2024-01-07 | Disposition: A | Discharge status: Home / Self Care | Attending: Emergency Medicine | Admitting: Emergency Medicine

## 2024-01-07 ENCOUNTER — Other Ambulatory Visit: Payer: Self-pay

## 2024-01-07 DIAGNOSIS — R509 Fever, unspecified: Secondary | ICD-10-CM | POA: Diagnosis present

## 2024-01-07 DIAGNOSIS — U071 COVID-19: Secondary | ICD-10-CM | POA: Insufficient documentation

## 2024-01-07 LAB — RESP PANEL BY RT-PCR (RSV, FLU A&B, COVID)  RVPGX2
Influenza A by PCR: NEGATIVE
Influenza B by PCR: NEGATIVE
Resp Syncytial Virus by PCR: NEGATIVE
SARS Coronavirus 2 by RT PCR: POSITIVE — AB

## 2024-01-07 LAB — GROUP A STREP BY PCR: Group A Strep by PCR: NOT DETECTED

## 2024-01-07 MED ORDER — ONDANSETRON HCL 4 MG/2ML IJ SOLN
4.0000 mg | Freq: Once | INTRAMUSCULAR | Status: AC
  Administered 2024-01-07: 4 mg via INTRAVENOUS
  Filled 2024-01-07: qty 2

## 2024-01-07 MED ORDER — ONDANSETRON 4 MG PO TBDP: 4.0000 mg | ORAL_TABLET | Freq: Three times a day (TID) | ORAL | 0 refills | Status: AC | PRN

## 2024-01-07 MED ORDER — SODIUM CHLORIDE 0.9 % IV BOLUS
500.0000 mL | Freq: Once | INTRAVENOUS | Status: AC
  Administered 2024-01-07: 500 mL via INTRAVENOUS

## 2024-01-07 MED ORDER — NIRMATRELVIR/RITONAVIR (PAXLOVID)TABLET: 3.0000 | ORAL_TABLET | Freq: Two times a day (BID) | ORAL | 0 refills | Status: AC

## 2024-01-07 MED ORDER — ACETAMINOPHEN 500 MG PO TABS: 500.0000 mg | ORAL_TABLET | Freq: Four times a day (QID) | ORAL | 0 refills | Status: AC | PRN

## 2024-01-07 MED ORDER — IBUPROFEN 600 MG PO TABS
600.0000 mg | ORAL_TABLET | Freq: Once | ORAL | Status: AC
  Administered 2024-01-07: 600 mg via ORAL
  Filled 2024-01-07: qty 1

## 2024-01-07 NOTE — ED Notes (Signed)
 Patient gagged after strep culture was obtained and vomited in emesis bag.

## 2024-01-07 NOTE — ED Provider Triage Note (Signed)
 Emergency Medicine Provider Triage Evaluation Note  Gagan Dillion , a 38 y.o. male  was evaluated in triage.  Pt complains of nasal congestion.  Patient works for [employer].  Patient reports feeling body aches, nasal congestion, sore throat, no fever.  Review of Systems  Positive:  Negative:  Physical Exam  BP (!) 111/58   Pulse (!) 106   Temp 98.7 F (37.1 C)   Resp 19   Ht 5' 9 (1.753 m)   Wt 64.4 kg   SpO2 95%   BMI 20.97 kg/m during triage patient was tachycardic Gen:   Awake, no distress   Resp:  Normal effort  MSK:   Moves extremities without difficulty  Other:    Medical Decision Making  Medically screening exam initiated at 5:01 PM.  Appropriate orders placed.  Jourdan Durbin was informed that the remainder of the evaluation will be completed by another provider, this initial triage assessment does not replace that evaluation, and the importance of remaining in the ED until their evaluation is complete. Patient who presents today with history of 24 hours of bodyaches nasal congestion sore throat, no fever.  Ordered respiratory panel    Janit Kast, PA-C 01/07/24 1702

## 2024-01-07 NOTE — Discharge Instructions (Signed)
 You have been diagnosed with COVID-19.  Please drink plenty of fluids.  You can take Zofran  20 minutes before main meals to prevent nausea and vomiting.  Please take Paxlovid  3 tablets by mouth every 12 hours for 5 days.  You can take Tylenol  1 tablet by mouth every 6 hours for sore throat, body aches.  Please follow quarantine and isolation rules.  You can call [facility] clinic, they can appointment and establish care with a primary care physician.  Please come back to ED or go to [facility] if you have new symptoms symptoms worsen

## 2024-01-07 NOTE — ED Notes (Signed)
 PT in no acute distress prior to discharge. Discharged instructions reviewed and pt stated that they understand directions. Pt has all belongings with them at time of discharge.

## 2024-01-07 NOTE — ED Triage Notes (Signed)
 Pt comes with c/o body aches, chills and fever. Pt states vomiting.

## 2024-01-07 NOTE — ED Provider Notes (Signed)
 [facility] Provider Note    Event Date/Time   First MD Initiated Contact with Patient 01/07/24 1724     (approximate)   History   Fever    HPI  Adrian Oconnor is a 38 y.o. male    with a past medical history of tic disorder, strep pharyngitis, viral gastroenteritis, dysuria, influenza, who presents to the ED complaining of bodyaches, chills, fever and vomited. According to the patient symptoms started last night, consisting body ache, chills, fever, vomit,  unable to eat anything today.  Patient denies cough, chest pain, abdominal pain, urinary symptoms, diarrhea.     There are no active problems to display for this patient.    ROS: Patient currently denies any vision changes, tinnitus, difficulty speaking, facial droop, sore throat, chest pain, shortness of breath, abdominal pain, nausea/vomiting/diarrhea, dysuria, or weakness/numbness/paresthesias in any extremity   Physical Exam   Triage Vital Signs: ED Triage Vitals  Encounter Vitals Group     BP 01/07/24 1655 (!) 111/58     Girls Systolic BP Percentile --      Girls Diastolic BP Percentile --      Boys Systolic BP Percentile --      Boys Diastolic BP Percentile --      Pulse Rate 01/07/24 1655 (!) 106     Resp 01/07/24 1655 19     Temp 01/07/24 1655 98.7 F (37.1 C)     Temp src --      SpO2 01/07/24 1655 95 %     Weight 01/07/24 1654 142 lb (64.4 kg)     Height 01/07/24 1654 5' 9 (1.753 m)     Head Circumference --      Peak Flow --      Pain Score 01/07/24 1654 8     Pain Loc --      Pain Education --      Exclude from Growth Chart --     Most recent vital signs: Vitals:   01/07/24 1655  BP: (!) 111/58  Pulse: (!) 106  Resp: 19  Temp: 98.7 F (37.1 C)  SpO2: 95%     Physical Exam Vitals and nursing note reviewed.  During triage patient was tachycardic, hypotensive.  Constitutional:      General: Awake and alert. Mild distress.    Appearance: Normal appearance. The  patient is normal weight.      Able to speak in complete sentences without cough or dyspnea  HENT:     Head: Normocephalic and atraumatic.     Mouth: Mucous membranes are moist.  Enlarged tonsils, exudate, peritonsillar erythema Ears: Bilateral otoscopy within normal limits Eyes:     General: PERRL. Normal EOMs          Conjunctiva/sclera: Conjunctivae normal.  Nose  congestion/clear rhinorrhea Face: No tenderness to palpation in frontal or maxillary sinuses.  CV:                  Good peripheral perfusion.  Regular rate and rhythm  Resp:               Normal effort.  Equal breath sounds bilaterally.  No wheezing Abd:                 No distention.  Soft, nontender.  No rebound or guarding.  Tender to palpation, muscular pain due to vomit, McBurney point negative, Rovsing negative. Musculoskeletal:        General: No swelling. Normal range of motion.  Skin:    General: Skin is warm and dry.     Capillary Refill: Capillary refill takes less than 2 seconds.     Findings: No rash.  Neurological:     Mental Status: The patient is awake and alert. MAE spontaneously. No gross focal neurologic deficits are appreciated.  Psychiatric Mood and affect are normal. Speech and behavior are normal.  ED Results / Procedures / Treatments   Labs (all labs ordered are listed, but only abnormal results are displayed) Labs Reviewed  RESP PANEL BY RT-PCR (RSV, FLU A&B, COVID)  RVPGX2 - Abnormal; Notable for the following components:      Result Value   SARS Coronavirus 2 by RT PCR POSITIVE (*)    All other components within normal limits  GROUP A STREP BY PCR     EKG     RADIOLOGY     PROCEDURES:  Critical Care performed:   Procedures   MEDICATIONS ORDERED IN ED: Medications  sodium chloride  0.9 % bolus 500 mL (500 mLs Intravenous New Bag/Given 01/07/24 1808)  ondansetron  (ZOFRAN ) injection 4 mg (4 mg Intravenous Given 01/07/24 1810)  ibuprofen  (ADVIL ) tablet 600 mg (600 mg Oral  Given 01/07/24 1814)   Clinical Course as of 01/07/24 1831  Fri Jan 07, 2024  1819 COVID-positive [AE]  1819 Reassessed the patient, patient feels with more energy after getting the IV fluids.  Updated patient with results of COVID-positive.  Patient wants to get Paxlovid , patient will get a note for work.  I did recommend the patient to isolate for 5 days.  Patient is ready for discharge after finishing the IV fluids.  Patient is agreeable with the plan [AE]    Clinical Course User Index [AE] Janit Kast, PA-C    IMPRESSION / MDM / ASSESSMENT AND PLAN / ED COURSE  I reviewed the triage vital signs and the nursing notes.  Differential diagnosis includes, but is not limited to, COVID, dehydration, influenza, RSV, strep a, viral gastroenteritis, unlikely pneumonia,   Patient's presentation is most consistent with acute complicated illness / injury requiring diagnostic workup.    Adrian Oconnor is a 38 y.o., male who presents today with history of 24 hours of fever, chills, body aches, vomited, unable to eat.  On physical exam evidence of peritonsillar erythema, exudates, enlarged tonsils, tenderness to palpation in abdominal wall, no signs of peritoneal irritation.  Rest of physical exam is normal. Plan IV fluids Zofran  IV Ibuprofen  Strep Waiting for results of respiratory panel ordered through triage Reassess Reassessed the patient, patient feels much more energy after getting IV fluids Zofran  and ibuprofen .  Updated patient with results of COVID-positive.  Patient decides to take Paxlovid .  Advised patient on isolation for 5 days.  Patient will have a work note.  Patient's diagnosis is consistent with COVID. I did not order any imaging, physical exam was reassuring.  Labs are  reassuring. I did review the patient's allergies and medications.The patient is in stable and satisfactory condition for discharge home  Patient will be discharged home with prescriptions for Paxlovid . Patient is  to follow up with PCP as needed or otherwise directed. Patient is given ED precautions to return to the ED for any worsening or new symptoms.  Patient does not have a PCP I will refer him to [place] [facility] to establish care.  Discussed plan of care with patient, answered all of patient's questions, patient agreeable to plan of care. Advised patient to take medications according to the instructions on the  label. Discussed possible side effects of new medications. Patient verbalized understanding.   FINAL CLINICAL IMPRESSION(S) / ED DIAGNOSES   Final diagnoses:  COVID-19     Rx / DC Orders   ED Discharge Orders          Ordered    nirmatrelvir /ritonavir  (PAXLOVID ) 20 x 150 MG & 10 x 100MG  TABS  2 times daily        01/07/24 1829    ondansetron  (ZOFRAN -ODT) 4 MG disintegrating tablet  Every 8 hours PRN        01/07/24 1829    acetaminophen  (TYLENOL ) 500 MG tablet  Every 6 hours PRN        01/07/24 1829             Note:  This document was prepared using Dragon voice recognition software and may include unintentional dictation errors.   Janit Kast, PA-C 01/07/24 1831    Willo Dunnings, MD 01/07/24 1911
